# Patient Record
Sex: Male | Born: 1960 | Race: White | Hispanic: No | Marital: Married | Smoking: Current every day smoker
Health system: Southern US, Community
[De-identification: ages and names within clinical notes are randomized; demographics above are authoritative.]

## PROBLEM LIST (undated history)

## (undated) DIAGNOSIS — I10 Essential (primary) hypertension: Secondary | ICD-10-CM

## (undated) HISTORY — PX: APPENDECTOMY: SHX54

---

## 2011-09-12 ENCOUNTER — Encounter: Payer: Self-pay | Admitting: Emergency Medicine

## 2011-09-12 ENCOUNTER — Emergency Department
Admission: EM | Admit: 2011-09-12 | Discharge: 2011-09-12 | Disposition: A | Discharge status: Home / Self Care | Payer: BC Managed Care – PPO | Source: Home / Self Care | Attending: Emergency Medicine | Admitting: Emergency Medicine

## 2011-09-12 DIAGNOSIS — L723 Sebaceous cyst: Secondary | ICD-10-CM

## 2011-09-12 NOTE — ED Provider Notes (Addendum)
History     CSN: [id number]  Arrival date & time 09/12/11  1403   First MD Initiated Contact with Patient 09/12/11 1413      Chief Complaint  Patient presents with  . Cellulitis    (Consider location/radiation/quality/duration/timing/severity/associated sxs/prior treatment) HPI  History reviewed. No pertinent past medical history.  History reviewed. No pertinent past surgical history.  No family history on file.  History  Substance Use Topics  . Smoking status: Not on file  . Smokeless tobacco: Not on file  . Alcohol Use:       Review of Systems  Allergies  Review of patient's allergies indicates no known allergies.  Home Medications  No current outpatient prescriptions on file.  BP 134/83  Pulse 80  Temp(Src) 98.6 F (37 C) (Oral)  Resp 18  Ht 5\' 9"  (1.753 m)  Wt 194 lb (87.998 kg)  BMI 28.65 kg/m2  SpO2 99%  Physical Exam  ED Course  Procedures (including critical care time)  Labs Reviewed - No data to display No results found.   No diagnosis found.    MDM   This patient has a sebaceous cyst on his mid back and his left-sided space. I would be more comfortable with a dermatologist or plastic surgeon operating to remove the capsule and any other remnants that are necessary. Because of this, I will not bill him for today's visit and will instead sent directly to a dermatologist.  Lily Kocher, MD 09/12/11 1432  Lily Kocher, MD 09/12/11 [phone]

## 2011-09-12 NOTE — ED Notes (Signed)
Bump on left cheek and middle of back x 1 yr, Sebaceous cyst?

## 2012-07-19 ENCOUNTER — Emergency Department (INDEPENDENT_AMBULATORY_CARE_PROVIDER_SITE_OTHER)
Admission: EM | Admit: 2012-07-19 | Discharge: 2012-07-19 | Disposition: A | Discharge status: Home / Self Care | Payer: BC Managed Care – PPO | Source: Home / Self Care | Attending: Family Medicine | Admitting: Family Medicine

## 2012-07-19 ENCOUNTER — Emergency Department: Admission: EM | Admit: 2012-07-19 | Discharge: 2012-07-19 | Payer: Self-pay | Source: Home / Self Care

## 2012-07-19 ENCOUNTER — Encounter: Payer: Self-pay | Admitting: *Deleted

## 2012-07-19 DIAGNOSIS — R509 Fever, unspecified: Secondary | ICD-10-CM

## 2012-07-19 DIAGNOSIS — J111 Influenza due to unidentified influenza virus with other respiratory manifestations: Secondary | ICD-10-CM

## 2012-07-19 MED ORDER — OSELTAMIVIR PHOSPHATE 75 MG PO CAPS: 75.0000 mg | ORAL_CAPSULE | Freq: Two times a day (BID) | ORAL | Status: DC

## 2012-07-19 NOTE — ED Provider Notes (Signed)
History     CSN: [id number]  Arrival date & time 07/19/12  1220   First MD Initiated Contact with Patient 07/19/12 1246      Chief Complaint  Patient presents with  . Fever  . Chills  . Generalized Body Aches      HPI Comments: Pt complains of sudden onset fever, chills/sweats, headache, fatigue, and body aches for 2 days.  He has taken theraflu with no relief.   The history is provided by the patient.    History reviewed. No pertinent past medical history.  Past Surgical History  Procedure Date  . Appendectomy     History reviewed. No pertinent family history.  History  Substance Use Topics  . Smoking status: Current Every Day Smoker -- 1.0 packs/day for 35 years  . Smokeless tobacco: Not on file  . Alcohol Use: Yes      Review of Systems No sore throat No cough No pleuritic pain No wheezing No nasal congestion No post-nasal drainage No sinus pain/pressure No itchy/red eyes No earache No hemoptysis No SOB + fever, + chills No nausea No vomiting No abdominal pain No diarrhea No urinary symptoms No skin rashes + fatigue + myalgias + headache Used OTC meds without relief  Allergies  Review of patient's allergies indicates no known allergies.  Home Medications   Current Outpatient Rx  Name  Route  Sig  Dispense  Refill  . OSELTAMIVIR PHOSPHATE 75 MG PO CAPS   Oral   Take 1 capsule (75 mg total) by mouth every 12 (twelve) hours.   10 capsule   0     BP 112/82  Pulse 93  Temp 98.3 F (36.8 C) (Oral)  Resp 16  Ht 5\' 11"  (1.803 m)  Wt 195 lb (88.451 kg)  BMI 27.20 kg/m2  SpO2 98%  Physical Exam Nursing notes and Vital Signs reviewed. Appearance:  Patient appears healthy, stated age, and in no acute distress Eyes:  Pupils are equal, round, and reactive to light and accomodation.  Extraocular movement is intact.  Conjunctivae are not inflamed  Ears:  Canals normal.  Tympanic membranes normal.  Nose:  Mildly congested turbinates.  No  sinus tenderness.    Pharynx:  Normal Neck:  Supple.  Slightly tender shotty posterior nodes are palpated bilaterally  Lungs:  Clear to auscultation.  Breath sounds are equal.  Heart:  Regular rate and rhythm without murmurs, rubs, or gallops.  Abdomen:  Nontender without masses or hepatosplenomegaly.  Bowel sounds are present.  No CVA or flank tenderness.  Extremities:  No edema.  No calf tenderness Skin:  No rash present.   ED Course  Procedures  none   Labs Reviewed  POCT INFLUENZA A/B negative      1. Influenza-like illness       MDM  Empirically begin Tamiflu. Take Mucinex D (guaifenesin with decongestant) twice daily for congestion.  Increase fluid intake, rest. May use Afrin nasal spray (or generic oxymetazoline) twice daily for about 5 days.  Also recommend using saline nasal spray several times daily and saline nasal irrigation (AYR is a common brand) Stop all antihistamines for now, and other non-prescription cough/cold preparations. May take Ibuprofen 200mg , 4 tabs every 8 hours with food for headache, fever, etc. May take Delsym Cough Suppressant at bedtime for nighttime cough.  Recommend flu shot when well. Follow-up with family doctor if not improving 7 to 10 days.         Lattie Haw, MD 07/26/12 1025

## 2012-07-19 NOTE — ED Notes (Signed)
Pt c/o fever, chills and body aches x 2 days.  He has taken theraflu with no relief.

## 2012-07-19 NOTE — ED Notes (Signed)
Pt c/o fever, chills and body aches x 2 days. He has taken Theraflu with no relief.

## 2012-07-30 LAB — POCT INFLUENZA A/B
Influenza A, POC: NEGATIVE
Influenza B, POC: NEGATIVE

## 2012-08-03 ENCOUNTER — Encounter: Payer: Self-pay | Admitting: *Deleted

## 2012-08-03 ENCOUNTER — Emergency Department (INDEPENDENT_AMBULATORY_CARE_PROVIDER_SITE_OTHER): Payer: BC Managed Care – PPO

## 2012-08-03 ENCOUNTER — Emergency Department (INDEPENDENT_AMBULATORY_CARE_PROVIDER_SITE_OTHER)
Admission: EM | Admit: 2012-08-03 | Discharge: 2012-08-03 | Disposition: A | Discharge status: Home / Self Care | Payer: BC Managed Care – PPO | Source: Home / Self Care | Attending: Family Medicine | Admitting: Family Medicine

## 2012-08-03 DIAGNOSIS — R6883 Chills (without fever): Secondary | ICD-10-CM

## 2012-08-03 DIAGNOSIS — R05 Cough: Secondary | ICD-10-CM

## 2012-08-03 DIAGNOSIS — J209 Acute bronchitis, unspecified: Secondary | ICD-10-CM

## 2012-08-03 DIAGNOSIS — R918 Other nonspecific abnormal finding of lung field: Secondary | ICD-10-CM

## 2012-08-03 MED ORDER — LEVOFLOXACIN 500 MG PO TABS: 500.0000 mg | ORAL_TABLET | Freq: Every day | ORAL | Status: DC

## 2012-08-03 MED ORDER — CEFTRIAXONE SODIUM 1 G IJ SOLR
1.0000 g | Freq: Once | INTRAMUSCULAR | Status: AC
  Administered 2012-08-03: 1 g via INTRAMUSCULAR

## 2012-08-03 MED ORDER — BENZONATATE 200 MG PO CAPS: 200.0000 mg | ORAL_CAPSULE | Freq: Every day | ORAL | Status: DC

## 2012-08-03 NOTE — ED Notes (Signed)
Patient was seen 2 weeks ago for fever, chills, sweats, checked for flu which was negative. He finished Tamiflu Rx and improved but symptoms returned along with congestion 5 days ago. His wife came down with similar symptoms after his first visit. He has taken Dayquil and advil

## 2012-08-03 NOTE — ED Provider Notes (Signed)
History     CSN: [id number]  Arrival date & time 08/03/12  1224   First MD Initiated Contact with Patient 08/03/12 1318      Chief Complaint  Patient presents with  . Nasal Congestion  . Chills     HPI Comments: Patient reports that his flu symptoms essentially resolved several days after starting Tamiflu.  However, 6 days ago he again developed flu like symptoms with fever/chills, sinus congestion, mild sore throat, headache, and partly productive cough.  He has developed a mild pleuritic pain in his right anterior chest.  The history is provided by the patient.    History reviewed. No pertinent past medical history.  Past Surgical History  Procedure Date  . Appendectomy     History reviewed. No pertinent family history.  History  Substance Use Topics  . Smoking status: Current Every Day Smoker -- 1.0 packs/day for 35 years  . Smokeless tobacco: Not on file  . Alcohol Use: Yes      Review of Systems + sore throat + cough ? pleuritic pain right anterior chest + wheezing + nasal congestion + post-nasal drainage No sinus pain/pressure No itchy/red eyes No earache No hemoptysis No SOB + fever, + chills No nausea No vomiting No abdominal pain No diarrhea No urinary symptoms No skin rashes + fatigue + myalgias + headache Used OTC meds without relief  Allergies  Review of patient's allergies indicates no known allergies.  Home Medications   Current Outpatient Rx  Name  Route  Sig  Dispense  Refill  . BENZONATATE 200 MG PO CAPS   Oral   Take 1 capsule (200 mg total) by mouth at bedtime. Take as needed for cough   12 capsule   0   . LEVOFLOXACIN 500 MG PO TABS   Oral   Take 1 tablet (500 mg total) by mouth daily.   7 tablet   0   . OSELTAMIVIR PHOSPHATE 75 MG PO CAPS   Oral   Take 1 capsule (75 mg total) by mouth every 12 (twelve) hours.   10 capsule   0     BP 117/78  Pulse 95  Temp 98.5 F (36.9 C) (Oral)  Resp 18  Ht 5\' 10"   (1.778 m)  Wt 189 lb (85.73 kg)  BMI 27.12 kg/m2  SpO2 98%  Physical Exam Nursing notes and Vital Signs reviewed. Appearance:  Patient appears healthy, stated age, and in no acute distress Eyes:  Pupils are equal, round, and reactive to light and accomodation.  Extraocular movement is intact.  Conjunctivae are not inflamed  Ears:  Canals normal.  Tympanic membranes normal.  Nose:  Mildly congested turbinates.  No sinus tenderness.  Pharynx:  Normal Neck:  Supple.   No adenopathy Lungs:  Clear to auscultation.  Breath sounds are equal.  Heart:  Regular rate and rhythm without murmurs, rubs, or gallops.  Abdomen:  Nontender without masses or hepatosplenomegaly.  Bowel sounds are present.  No CVA or flank tenderness.  Extremities:  No edema.  No calf tenderness Skin:  No rash present.   ED Course  Procedures (including critical care time)  Labs Reviewed - No data to display Dg Chest 2 View  08/03/2012  *RADIOLOGY REPORT*  Clinical Data: Chills and cough.  CHEST - 2 VIEW  Comparison: None  Findings: The cardiac silhouette, mediastinal and hilar contours are normal.  Ill-defined left lower lobe airspace opacity could reflect a developing infiltrate.  Mild peribronchial thickening is noted.  No pleural effusion or pneumothorax.  The bony thorax is intact.  IMPRESSION: Probable mild bronchitis with developing left lower lobe infiltrate.   Original Report Authenticated By: Rudie Meyer, M.D.      1. Acute bronchitis; possible early left lower lobe pneumonia       MDM  Rocephin 1 gm IM.  Begin Levaquin 500mg  once daily. Prescription written for Benzonatate [facility]) to take at bedtime for night-time cough.  Take plain Mucinex (guaifenesin) twice daily for cough and congestion.  May add Sudafed for sinus congestion.  Increase fluid intake, rest. Stop all antihistamines for now, and other non-prescription cough/cold preparations. May take Tylenol for pain, fever, etc. Follow-up with  family doctor in about 10 days for repeat chest X-ray Return for worsening symptoms:  shortness of breath, increasing chest pain, etc.         Lattie Haw, MD 08/03/12 [phone]

## 2013-12-14 ENCOUNTER — Emergency Department (INDEPENDENT_AMBULATORY_CARE_PROVIDER_SITE_OTHER): Payer: 59

## 2013-12-14 ENCOUNTER — Emergency Department
Admission: EM | Admit: 2013-12-14 | Discharge: 2013-12-14 | Disposition: A | Discharge status: Home / Self Care | Payer: 59 | Source: Home / Self Care

## 2013-12-14 ENCOUNTER — Encounter: Payer: Self-pay | Admitting: Emergency Medicine

## 2013-12-14 DIAGNOSIS — R079 Chest pain, unspecified: Secondary | ICD-10-CM

## 2013-12-14 DIAGNOSIS — IMO0002 Reserved for concepts with insufficient information to code with codable children: Secondary | ICD-10-CM

## 2013-12-14 DIAGNOSIS — F17200 Nicotine dependence, unspecified, uncomplicated: Secondary | ICD-10-CM

## 2013-12-14 DIAGNOSIS — S29011A Strain of muscle and tendon of front wall of thorax, initial encounter: Secondary | ICD-10-CM

## 2013-12-14 NOTE — ED Notes (Signed)
Wesley Oneill c/o left sided CP x 2-3 days. Denies SOB, numbness, nausea, etc. C/o increase in stress. No recent activity.

## 2013-12-14 NOTE — Discharge Instructions (Signed)
Apply ice pack for 30 to 45 minutes 3 to 4 times daily for 2 to 3 days.  May take Tylenol as needed for pain.    Muscle Strain A muscle strain is an injury that occurs when a muscle is stretched beyond its normal length. Usually a small number of muscle fibers are torn when this happens. Muscle strain is rated in degrees. First-degree strains have the least amount of muscle fiber tearing and pain. Second-degree and third-degree strains have increasingly more tearing and pain.  Usually, recovery from muscle strain takes 1 2 weeks. Complete healing takes 5 6 weeks.  CAUSES  Muscle strain happens when a sudden, violent force placed on a muscle stretches it too far. This may occur with lifting, sports, or a fall.  RISK FACTORS Muscle strain is especially common in athletes.  SIGNS AND SYMPTOMS At the site of the muscle strain, there may be:  Pain.  Bruising.  Swelling.  Difficulty using the muscle due to pain or lack of normal function. DIAGNOSIS  Your health care provider will perform a physical exam and ask about your medical history. TREATMENT  Often, the best treatment for a muscle strain is resting, icing, and applying cold compresses to the injured area.  HOME CARE INSTRUCTIONS   Use the PRICE method of treatment to promote muscle healing during the first 2 3 days after your injury. The PRICE method involves:  Protecting the muscle from being injured again.  Restricting your activity and resting the injured body part.  Icing your injury. To do this, put ice in a plastic bag. Place a towel between your skin and the bag. Then, apply the ice and leave it on from 15 20 minutes each hour. After the third day, switch to moist heat packs.  Apply compression to the injured area with a splint or elastic bandage. Be careful not to wrap it too tightly. This may interfere with blood circulation or increase swelling.  Elevate the injured body part above the level of your heart as often as  you can.  Only take over-the-counter or prescription medicines for pain, discomfort, or fever as directed by your health care provider.  Warming up prior to exercise helps to prevent future muscle strains. SEEK MEDICAL CARE IF:   You have increasing pain or swelling in the injured area.  You have numbness, tingling, or a significant loss of strength in the injured area. MAKE SURE YOU:   Understand these instructions.  Will watch your condition.  Will get help right away if you are not doing well or get worse. Document Released: 07/21/2005 Document Revised: 05/11/2013 Document Reviewed: 02/17/2013 [facility] Patient Information 2014 [place], [place].

## 2013-12-14 NOTE — ED Provider Notes (Signed)
CSN: [id number]     Arrival date & time 12/14/13  1338 History   None    Chief Complaint  Patient presents with  . Chest Pain     HPI Comments: Patient noticed dull left upper chest pain two days ago that has persisted.  The pain does not radiate.  He is a smoker and states that his chronic smoker's cough has increased somewhat over the past two days, although his pain is not pleuritic.  No shortness of breath or wheezing.  No fevers, chills, and sweats.  No nausea/vomiting.  The pain is not related to eating. Past history negative Family history of ASCVD in father who had stent place in his 770's  Patient is a 53 y.o. male presenting with chest pain. The history is provided by the patient.  Chest Pain Pain location:  L chest Pain quality: dull   Pain quality: not radiating and not stabbing   Pain radiates to:  Does not radiate Pain radiates to the back: no   Pain severity:  Mild Onset quality:  Gradual Duration:  2 days Timing:  Constant Progression:  Unchanged Chronicity:  New Context: at rest   Relieved by:  Nothing Worsened by:  Movement Ineffective treatments: Ibuprofen. Associated symptoms: cough   Associated symptoms: no abdominal pain, no back pain, no diaphoresis, no dysphagia, no fatigue, no fever, no heartburn, no lower extremity edema, no nausea, no numbness, no palpitations, no PND, no shortness of breath, not vomiting and no weakness   Risk factors: male sex and smoking     History reviewed. No pertinent past medical history. Past Surgical History  Procedure Laterality Date  . Appendectomy     Family History  Problem Relation Age of Onset  . Heart disease Father    History  Substance Use Topics  . Smoking status: Current Every Day Smoker -- 1.00 packs/day for 35 years  . Smokeless tobacco: Never Used  . Alcohol Use: Yes    Review of Systems  Constitutional: Negative for fever, diaphoresis and fatigue.  HENT: Negative for trouble swallowing.     Respiratory: Positive for cough. Negative for shortness of breath.   Cardiovascular: Positive for chest pain. Negative for palpitations and PND.  Gastrointestinal: Negative for heartburn, nausea, vomiting and abdominal pain.  Musculoskeletal: Negative for back pain.  Neurological: Negative for weakness and numbness.  All other systems reviewed and are negative.   Allergies  Review of patient's allergies indicates no known allergies.  Home Medications   Prior to Admission medications   Medication Sig Start Date End Date Taking? Authorizing Provider  benzonatate (TESSALON) 200 MG capsule Take 1 capsule (200 mg total) by mouth at bedtime. Take as needed for cough 08/03/12   Lattie HawStephen A Beese, MD  levofloxacin (LEVAQUIN) 500 MG tablet Take 1 tablet (500 mg total) by mouth daily. 08/03/12   Lattie HawStephen A Beese, MD  oseltamivir (TAMIFLU) 75 MG capsule Take 1 capsule (75 mg total) by mouth every 12 (twelve) hours. 07/19/12   Lattie HawStephen A Beese, MD   BP 169/97  Pulse 79  Resp 14  SpO2 98% Physical Exam  Nursing note and vitals reviewed. Constitutional: He is oriented to person, place, and time. He appears well-developed and well-nourished. No distress.  HENT:  Head: Normocephalic.  Nose: Nose normal.  Mouth/Throat: Oropharynx is clear and moist.  Eyes: Conjunctivae and EOM are normal. Pupils are equal, round, and reactive to light.  Neck: Neck supple.  Cardiovascular: Normal rate, regular rhythm, normal heart sounds  and intact distal pulses.  Exam reveals no gallop and no friction rub.   No murmur heard. Pulmonary/Chest: Effort normal and breath sounds normal. He has no wheezes. He has no rales. He exhibits tenderness.    There is distinct tenderness over the left pectoralis muscle, worse with resisted flexion of the left pectoralis muscle.  Abdominal: Soft. Bowel sounds are normal. There is no tenderness.  Lymphadenopathy:    He has no cervical adenopathy.  Neurological: He is alert and  oriented to person, place, and time.  Skin: Skin is warm and dry. No rash noted. No erythema.    ED Course  Procedures  none    Labs Reviewed -  EKG: Rate 84 PR:  0.140 QT:  0.370 QRS:  0.92 Axis:  24 degrees Interpretation:  within normal limits   Imaging Review Dg Chest 2 View  12/14/2013   CLINICAL DATA:  Chest pain, smoker, tender to palpation over pectoralis muscle  EXAM: CHEST  2 VIEW  COMPARISON:  08/03/2012  FINDINGS: Cardiomediastinal silhouette is stable. No acute infiltrate or pleural effusion. No pulmonary edema. Mild hyperinflation. Mild degenerative changes thoracic spine.  IMPRESSION: No active cardiopulmonary disease.  Mild hyperinflation.   Electronically Signed   By: Natasha MeadLiviu  Pop M.D.   On: 12/14/2013 14:23     MDM   1. Pectoralis muscle strain     Apply ice pack for 30 to 45 minutes 3 to 4 times daily for 2 to 3 days.  May take Tylenol as needed for pain.  Followup with Dr. Rodney Langtonhomas Thekkekand[facility] ([facility]) if not improving about two weeks.     Lattie HawStephen A Beese, MD 12/14/13 [phone]

## 2013-12-27 ENCOUNTER — Encounter: Payer: Self-pay | Admitting: Emergency Medicine

## 2014-10-21 IMAGING — CR DG CHEST 2V
2 series · 2 of 2 positions shown · non-contrast
Comparison: 08/03/2012

CLINICAL DATA: Chest pain, smoker, tender to palpation over
pectoralis muscle

EXAM:
CHEST  2 VIEW

[view not recorded (1 of 2)]
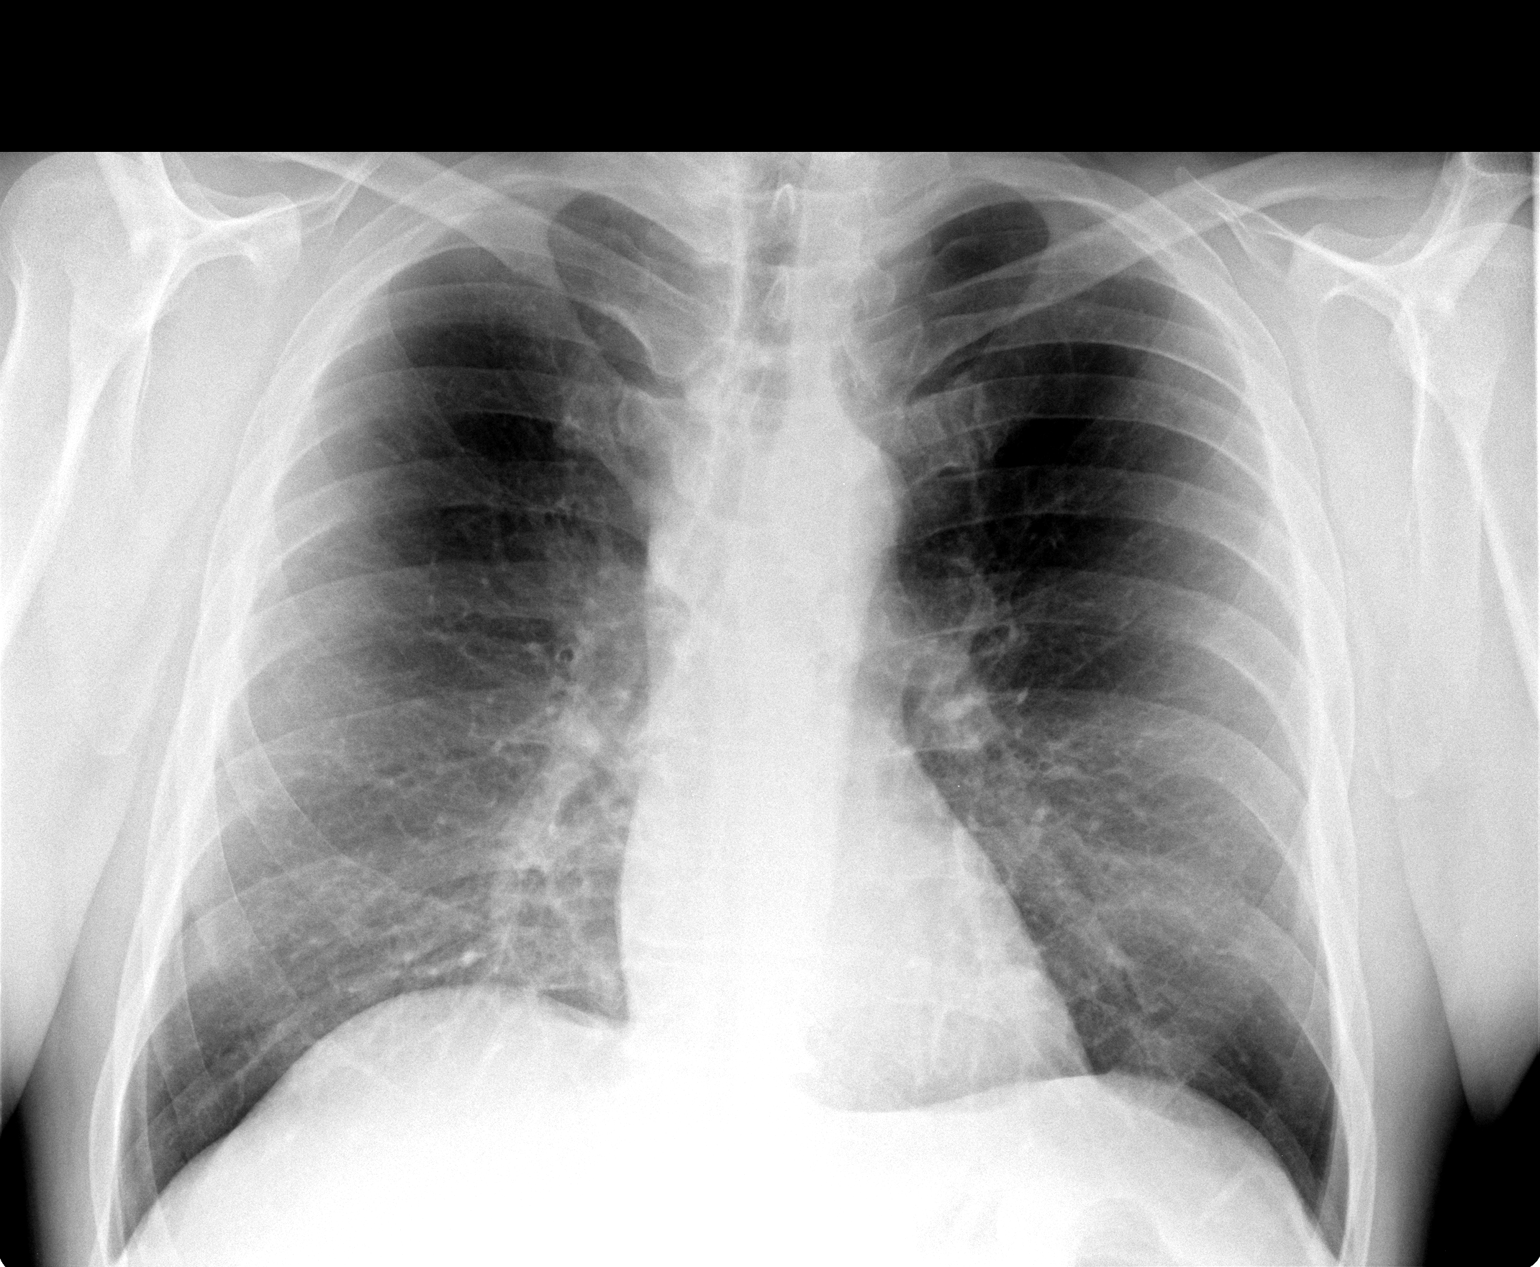

[view not recorded (2 of 2)]
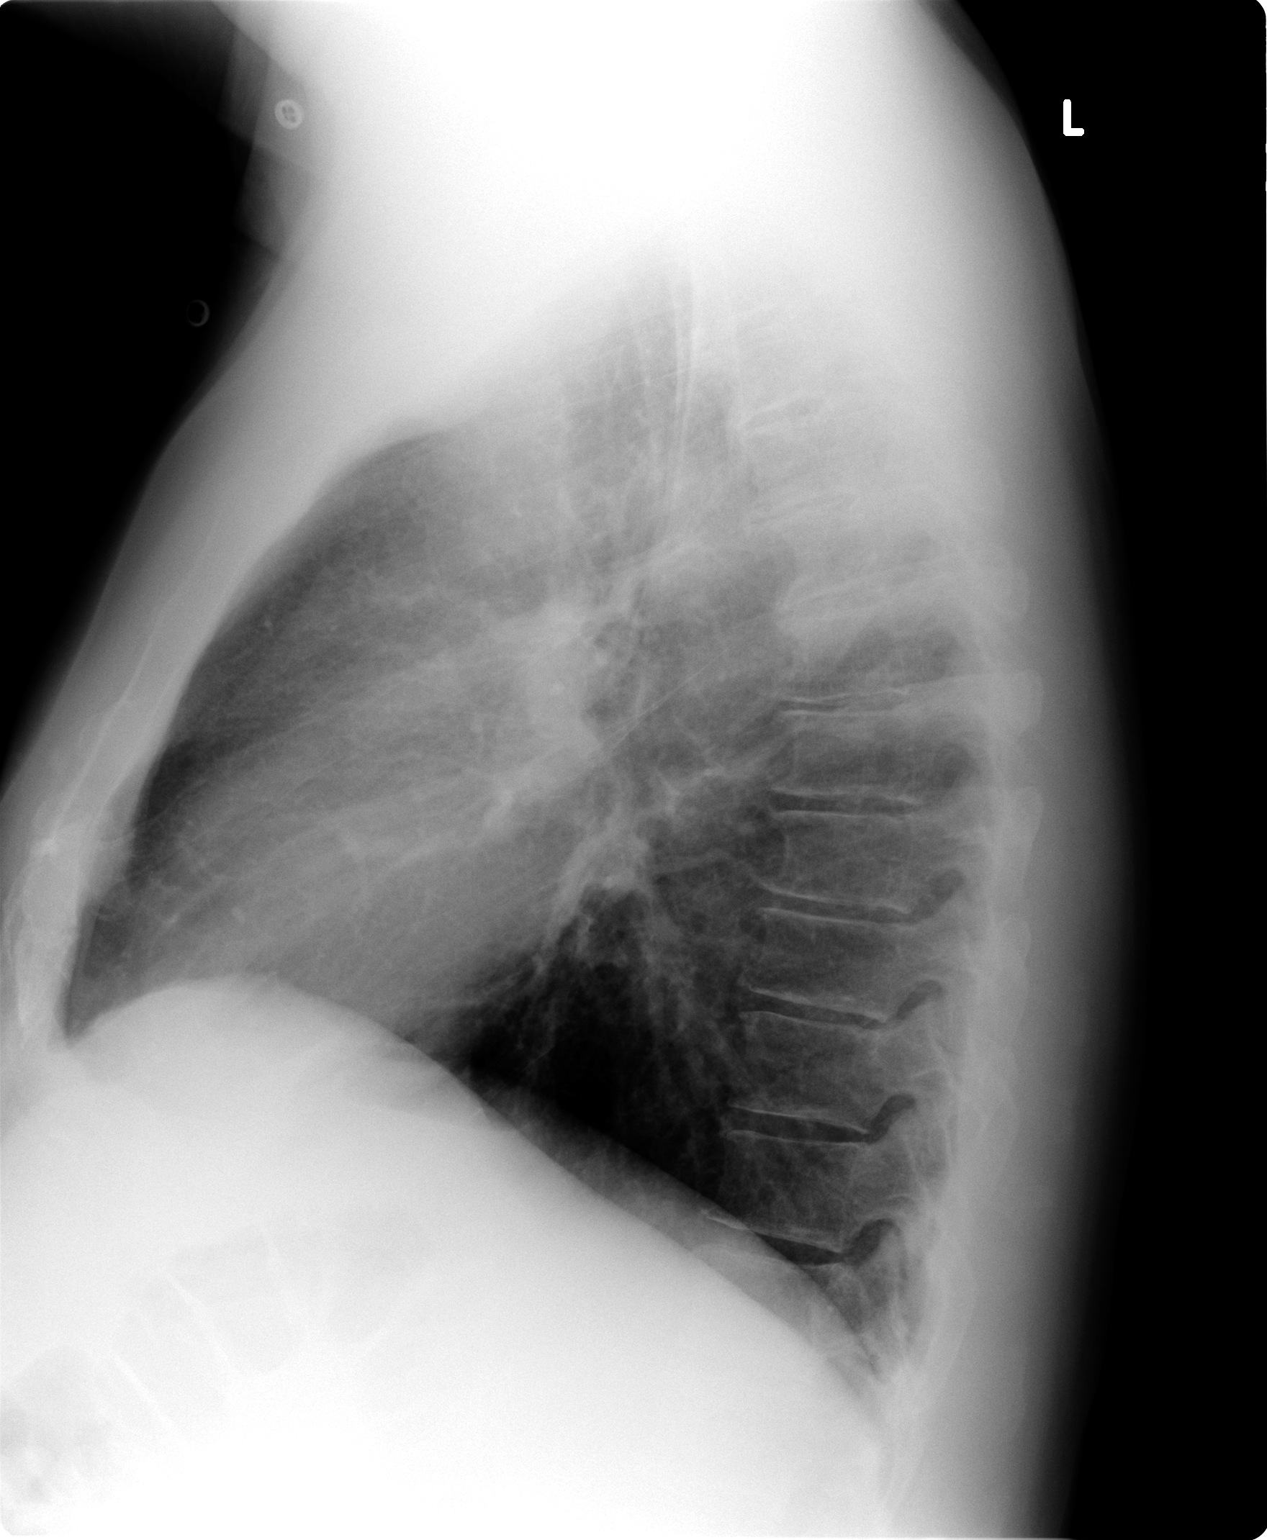

[2 of 2 positions shown; findings below may reference images not displayed]

FINDINGS: Cardiomediastinal silhouette is stable. No acute infiltrate or
pleural effusion. No pulmonary edema. Mild hyperinflation. Mild
degenerative changes thoracic spine.
IMPRESSION: No active cardiopulmonary disease.  Mild hyperinflation.

## 2017-06-06 ENCOUNTER — Emergency Department
Admission: EM | Admit: 2017-06-06 | Discharge: 2017-06-06 | Disposition: A | Discharge status: Home / Self Care | Payer: 59 | Source: Home / Self Care | Attending: Family Medicine | Admitting: Family Medicine

## 2017-06-06 ENCOUNTER — Encounter: Payer: Self-pay | Admitting: Emergency Medicine

## 2017-06-06 DIAGNOSIS — L03011 Cellulitis of right finger: Secondary | ICD-10-CM | POA: Diagnosis not present

## 2017-06-06 HISTORY — DX: Essential (primary) hypertension: I10

## 2017-06-06 MED ORDER — MUPIROCIN 2 % EX OINT: TOPICAL_OINTMENT | CUTANEOUS | 0 refills | Status: DC

## 2017-06-06 MED ORDER — DOXYCYCLINE HYCLATE 100 MG PO CAPS: 100.0000 mg | ORAL_CAPSULE | Freq: Two times a day (BID) | ORAL | 0 refills | Status: DC

## 2017-06-06 NOTE — ED Provider Notes (Signed)
[facility]    CSN: [id number] Arrival date & time: 06/06/17  1258     History   Chief Complaint Chief Complaint  Patient presents with  . Hand Pain    HPI Wesley Oneill is a 56 y.o. male.   HPI Wesley Oneill is a 56 y.o. male presenting to UC with c/o gradually worsening Right little finger pain, swelling and redness that started a few weeks ago but worsened over the last 2-3 days.  He works with his hands a lot and initially thought he hit it on something and symptoms would go away but he has been in and out of the hospital visiting a family member and has not had time to get his finger evaluated. His brother convinced him to be evaluated today due to fears pt may have MRSA infection.  Denies fever, chills, n/v/d. He has never been dx with MRSA in the past.   Past Medical History:  Diagnosis Date  . Hypertension     There are no active problems to display for this patient.   Past Surgical History:  Procedure Laterality Date  . APPENDECTOMY         Home Medications    Prior to Admission medications   Medication Sig Start Date End Date Taking? Authorizing Provider  doxycycline (VIBRAMYCIN) 100 MG capsule Take 1 capsule (100 mg total) by mouth 2 (two) times daily. One po bid x 10 days 06/06/17   Lurene Shadow, PA-C  mupirocin ointment Idelle Jo) 2 % Apply to finger 3 times daily for 7 days 06/06/17   Lurene Shadow, PA-C    Family History Family History  Problem Relation Age of Onset  . Heart disease Father     Social History Social History  Substance Use Topics  . Smoking status: Current Every Day Smoker    Packs/day: 1.00    Years: 35.00  . Smokeless tobacco: Never Used  . Alcohol use Yes     Allergies   Patient has no known allergies.   Review of Systems Review of Systems  Constitutional: Negative for chills and fever.  Musculoskeletal: Positive for arthralgias, joint swelling and myalgias.  Skin: Positive for color change.  Negative for wound.  Neurological: Negative for weakness and numbness.     Physical Exam Triage Vital Signs ED Triage Vitals  Enc Vitals Group     BP 06/06/17 1318 (!) 160/92     Pulse Rate 06/06/17 1318 84     Resp 06/06/17 1318 16     Temp 06/06/17 1318 98.7 F (37.1 C)     Temp Source 06/06/17 1318 Oral     SpO2 06/06/17 1318 98 %     Weight 06/06/17 1319 170 lb (77.1 kg)     Height 06/06/17 1319 5\' 10"  (1.778 m)     Head Circumference --      Peak Flow --      Pain Score --      Pain Loc --      Pain Edu? --      Excl. in GC? --    No data found.   Updated Vital Signs BP (!) 160/92 (BP Location: Left Arm)   Pulse 84   Temp 98.7 F (37.1 C) (Oral)   Resp 16   Ht 5\' 10"  (1.778 m)   Wt 170 lb (77.1 kg)   SpO2 98%   BMI 24.39 kg/m   Visual Acuity Right Eye Distance:   Left Eye Distance:  Bilateral Distance:    Right Eye Near:   Left Eye Near:    Bilateral Near:     Physical Exam  Constitutional: He is oriented to person, place, and time. He appears well-developed and well-nourished. No distress.  HENT:  Head: Normocephalic and atraumatic.  Eyes: EOM are normal.  Neck: Normal range of motion.  Cardiovascular: Normal rate.   Pulmonary/Chest: Effort normal.  Musculoskeletal: Normal range of motion. He exhibits edema and tenderness.  Right little finger: mild to moderate edema to distal aspect. Full ROM. Mild tenderness to distal aspect of finger.   Neurological: He is alert and oriented to person, place, and time.  Skin: Skin is warm and dry. He is not diaphoretic. There is erythema.  Right little finger: erythema and edema to distal phalanx, tender. No fluctuance. No bleeding or drainage.   Psychiatric: He has a normal mood and affect. His behavior is normal.  Nursing note and vitals reviewed.    UC Treatments / Results  Labs (all labs ordered are listed, but only abnormal results are displayed) Labs Reviewed - No data to display  EKG  EKG  Interpretation None       Radiology No results found.  Procedures Procedures (including critical care time)  Medications Ordered in UC Medications - No data to display   Initial Impression / Assessment and Plan / UC Course  I have reviewed the triage vital signs and the nursing notes.  Pertinent labs & imaging results that were available during my care of the patient were reviewed by me and considered in my medical decision making (see chart for details).     Hx and exam c/w cellulitis Right little finger. No areas of fluctuance on exam. Will not I&D at this time. Encouraged soaking in warm saltwater 2-3 times daily Start taking Doxycycline and using mupirocin ointment. F/u in 3-4 days if not improving, sooner if worsening.   Final Clinical Impressions(s) / UC Diagnoses   Final diagnoses:  Cellulitis of right little finger    New Prescriptions Discharge Medication List as of 06/06/2017  1:24 PM    START taking these medications   Details  doxycycline (VIBRAMYCIN) 100 MG capsule Take 1 capsule (100 mg total) by mouth 2 (two) times daily. One po bid x 10 days, Starting Sat 06/06/2017, Normal    mupirocin ointment (BACTROBAN) 2 % Apply to finger 3 times daily for 7 days, Normal         Controlled Substance Prescriptions [place] Controlled Substance Registry consulted? Not Applicable   Rolla Platehelps, [place] Morren O, PA-C 06/06/17 1551

## 2017-06-06 NOTE — Discharge Instructions (Signed)
°  You should try soaking your finger in warm saltwater 2-3 times daily for 10-15 minutes at a time to help soften the skin. Gently pat dry your finger, then apply the antibiotic ointment- mupirocin and a bandage.   Please take antibiotics as prescribed and be sure to complete entire course even if you start to feel better to ensure infection does not come back.  You may take 500mg  acetaminophen every 4-6 hours or in combination with ibuprofen 400-600mg  every 6-8 hours as needed for pain and inflammation.

## 2017-06-06 NOTE — ED Triage Notes (Signed)
[facility] in the right little finger, finger is swollen times two weeks, patient denies injury, and states he is unsure the cause.

## 2017-07-02 ENCOUNTER — Emergency Department
Admission: EM | Admit: 2017-07-02 | Discharge: 2017-07-02 | Disposition: A | Discharge status: Home / Self Care | Payer: 59 | Source: Home / Self Care | Attending: Family Medicine | Admitting: Family Medicine

## 2017-07-02 ENCOUNTER — Encounter: Payer: Self-pay | Admitting: Emergency Medicine

## 2017-07-02 ENCOUNTER — Other Ambulatory Visit: Payer: Self-pay

## 2017-07-02 ENCOUNTER — Emergency Department (INDEPENDENT_AMBULATORY_CARE_PROVIDER_SITE_OTHER): Payer: 59

## 2017-07-02 DIAGNOSIS — R683 Clubbing of fingers: Secondary | ICD-10-CM

## 2017-07-02 DIAGNOSIS — M7989 Other specified soft tissue disorders: Secondary | ICD-10-CM

## 2017-07-02 DIAGNOSIS — M79644 Pain in right finger(s): Secondary | ICD-10-CM

## 2017-07-02 MED ORDER — CLINDAMYCIN HCL 300 MG PO CAPS: ORAL_CAPSULE | ORAL | 0 refills | Status: DC

## 2017-07-02 NOTE — ED Triage Notes (Signed)
Patient here for follow up from 06/06/17; has had infection around nail of right little finger for about 6 weeks now and despite antibiotics it is still sore and edematous.

## 2017-07-02 NOTE — Discharge Instructions (Signed)
May take Tylenol for pain as needed. °

## 2017-07-02 NOTE — ED Provider Notes (Signed)
[facility]    CSN: [id number] Arrival date & time: 07/02/17  1541     History   Chief Complaint Chief Complaint  Patient presents with  . Hand Pain    right little finger infection nail     HPI Wesley Oneill is a 56 y.o. male.   Patient recently treated for cellulitis of his right fifth finger.  He had partial improvement after taking doxycycline and applying mupirocin ointment, but complains of persistent soreness in his fingertip.  He is assymptomatic otherwise.  He recalls accidentally hitting his fingertip about 6 weeks ago with a 5 pound hammer.   The history is provided by the patient.  Hand Pain  This is a recurrent problem. Episode onset: 6 weeks ago. The problem occurs constantly. The problem has been gradually improving. Pertinent negatives include no chest pain, no abdominal pain, no headaches and no shortness of breath. Exacerbated by: touching finger. Nothing relieves the symptoms. Treatments tried: antibiotic. The treatment provided mild relief.    Past Medical History:  Diagnosis Date  . Hypertension     There are no active problems to display for this patient.   Past Surgical History:  Procedure Laterality Date  . APPENDECTOMY         Home Medications    Prior to Admission medications   Medication Sig Start Date End Date Taking? Authorizing Provider  clindamycin (CLEOCIN) 300 MG capsule Take one cap by mouth every 8 hours 07/02/17   Lattie HawBeese, Stephen A, MD    Family History Family History  Problem Relation Age of Onset  . Heart disease Father     Social History Social History   Tobacco Use  . Smoking status: Current Every Day Smoker    Packs/day: 1.00    Years: 35.00    Pack years: 35.00  . Smokeless tobacco: Never Used  Substance Use Topics  . Alcohol use: Yes  . Drug use: No     Allergies   Patient has no known allergies.   Review of Systems Review of Systems  Constitutional: Negative for activity change,  appetite change, chills, diaphoresis, fatigue and fever.  HENT: Negative.   Eyes: Negative.   Respiratory: Negative for chest tightness and shortness of breath.   Cardiovascular: Negative for chest pain and palpitations.  Gastrointestinal: Negative for abdominal pain.  Genitourinary: Negative.   Neurological: Negative for headaches.     Physical Exam Triage Vital Signs ED Triage Vitals  Enc Vitals Group     BP 07/02/17 1606 136/79     Pulse Rate 07/02/17 1606 82     Resp 07/02/17 1606 16     Temp 07/02/17 1606 98.8 F (37.1 C)     Temp Source 07/02/17 1606 Oral     SpO2 07/02/17 1606 100 %     Weight 07/02/17 1607 175 lb (79.4 kg)     Height 07/02/17 1607 5\' 9"  (1.753 m)     Head Circumference --      Peak Flow --      Pain Score 07/02/17 1608 3     Pain Loc --      Pain Edu? --      Excl. in GC? --    No data found.  Updated Vital Signs BP 136/79 (BP Location: Left Arm)   Pulse 82   Temp 98.8 F (37.1 C) (Oral)   Resp 16   Ht 5\' 9"  (1.753 m)   Wt 175 lb (79.4 kg)   SpO2 100%  BMI 25.84 kg/m   Visual Acuity Right Eye Distance:   Left Eye Distance:   Bilateral Distance:    Right Eye Near:   Left Eye Near:    Bilateral Near:     Physical Exam  Constitutional: He appears well-developed and well-nourished. No distress.  HENT:  Head: Normocephalic.  Right Ear: External ear normal.  Left Ear: External ear normal.  Nose: Nose normal.  Mouth/Throat: Oropharynx is clear and moist.  Eyes: Conjunctivae are normal. Pupils are equal, round, and reactive to light.  Neck: Neck supple.  Cardiovascular: Normal heart sounds.  Pulmonary/Chest: Breath sounds normal.  Musculoskeletal: He exhibits no edema.       Hands: Right 5th fingertip has distinct clubbing of nail.  There is diffuse mild tenderness of distal phalanx and DIP joint.  Sensation intact.  Lymphadenopathy:    He has no cervical adenopathy.  Neurological: He is alert.  Skin: Skin is dry.  Nursing  note and vitals reviewed.    UC Treatments / Results  Labs (all labs ordered are listed, but only abnormal results are displayed) Labs Reviewed - No data to display  EKG  EKG Interpretation None       Radiology Dg Finger Little Right  Result Date: 07/02/2017 CLINICAL DATA:  Right little finger pain. Redness and swelling for 1 month. EXAM: RIGHT LITTLE FINGER 2+V COMPARISON:  No prior. FINDINGS: Soft tissue swelling noted of the distal aspect of the right fifth digit. Destructive changes are noted of the distal phalanx. An expansile lesion may be present. An active bony tumor including metastatic lesion could present this fashion. Infection could present this fashion. Orthopedic evaluation suggested. Diffuse degenerative change. No radiopaque foreign body. IMPRESSION: Destructive changes noted of the distal phalanx of the right fifth digit as described above. Associated soft tissue swelling. An active bony tumor including metastatic lesion could present this fashion. Infection could present this fashion. Orthopedic evaluation suggested. These results will be called to the ordering clinician or representative by the Radiologist Assistant, and communication documented in the PACS or zVision Dashboard. Electronically Signed   By: Maisie Fushomas  Register   On: 07/02/2017 16:45    Procedures Procedures (including critical care time)  Medications Ordered in UC Medications - No data to display   Initial Impression / Assessment and Plan / UC Course  I have reviewed the triage vital signs and the nursing notes.  Pertinent labs & imaging results that were available during my care of the patient were reviewed by me and considered in my medical decision making (see chart for details).    Destructive changes of right 5th finger distal phalanx, presumably initiated by trauma. Will need biopsy of lesion of distal phalanx (refer to orthopedist) Begin empiric clindamycin 300mg  TID. May take Tylenol for  pain as needed.      Final Clinical Impressions(s) / UC Diagnoses   Final diagnoses:  Finger clubbing  Pain of finger of right hand    ED Discharge Orders        Ordered    clindamycin (CLEOCIN) 300 MG capsule     07/02/17 1718           Lattie HawBeese, Stephen A, MD 07/02/17 2030

## 2017-07-03 ENCOUNTER — Telehealth: Payer: Self-pay | Admitting: Emergency Medicine

## 2017-08-07 ENCOUNTER — Other Ambulatory Visit: Payer: Self-pay

## 2018-05-09 IMAGING — DX DG FINGER LITTLE 2+V*R*
3 series · 3 of 3 positions shown · non-contrast
Comparison: No prior.

CLINICAL DATA: Right little finger pain. Redness and swelling for 1
month.

EXAM:
RIGHT LITTLE FINGER 2+V

[finger ap]
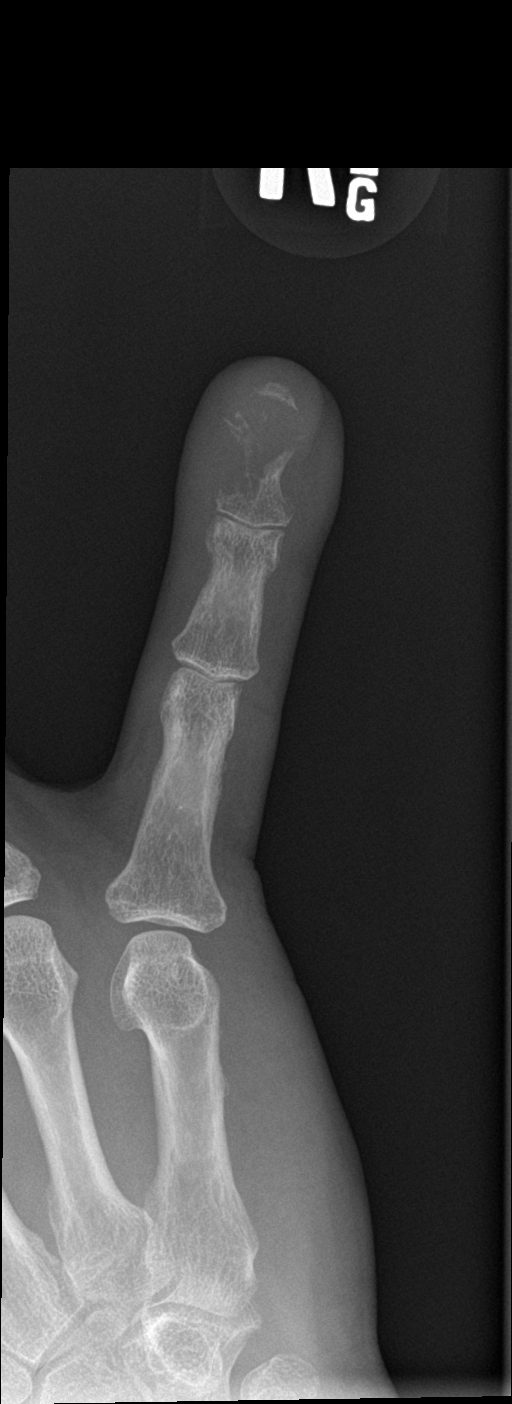

[finger obl]
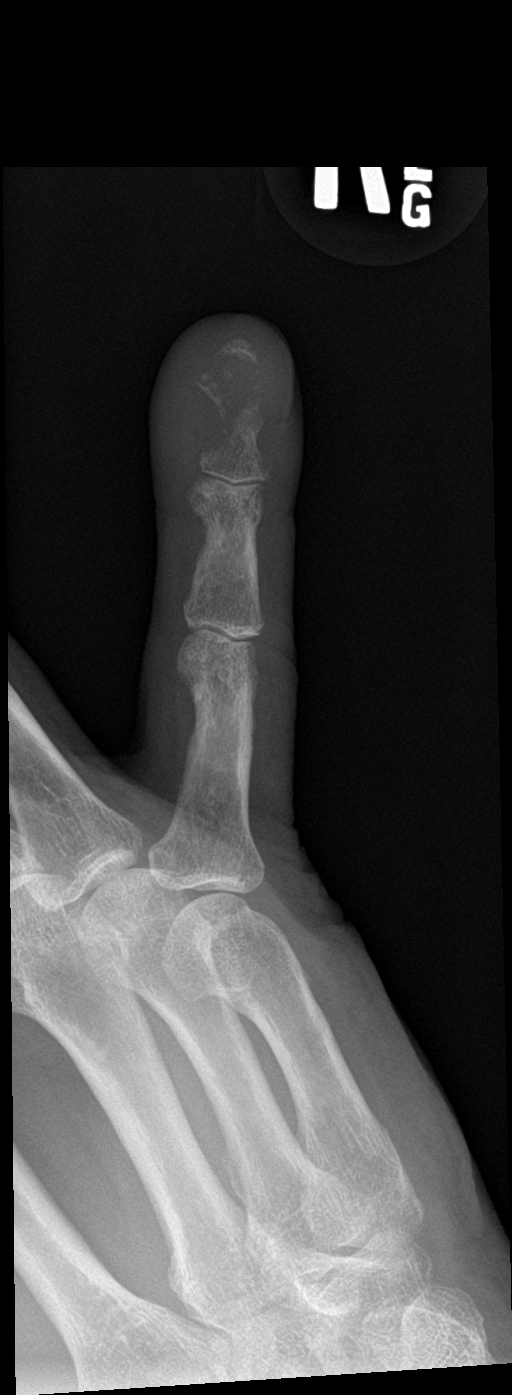

[finger lat]
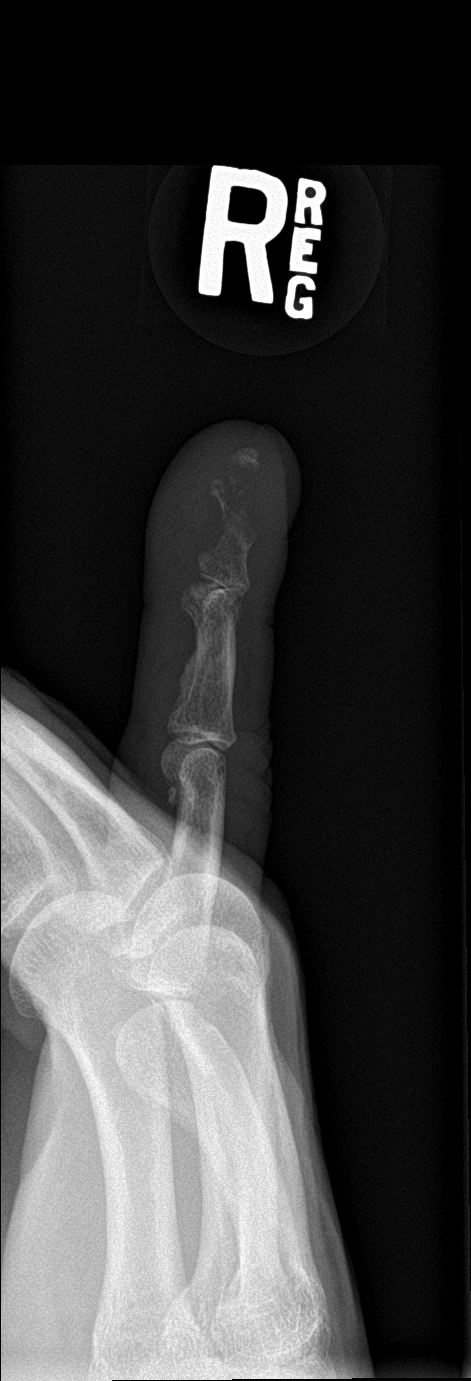

[3 of 3 positions shown; findings below may reference images not displayed]

FINDINGS: Soft tissue swelling noted of the distal aspect of the right fifth
digit. Destructive changes are noted of the distal phalanx. An
expansile lesion may be present. An active bony tumor including
metastatic lesion could present this fashion. Infection could
present this fashion. Orthopedic evaluation suggested. Diffuse
degenerative change. No radiopaque foreign body.
IMPRESSION: Destructive changes noted of the distal phalanx of the right fifth
digit as described above. Associated soft tissue swelling. An active
bony tumor including metastatic lesion could present this fashion.
Infection could present this fashion. Orthopedic evaluation
suggested.

These results will be called to the ordering clinician or
representative by the Radiologist Assistant, and communication
documented in the PACS or zVision Dashboard.

## 2018-09-12 ENCOUNTER — Emergency Department (INDEPENDENT_AMBULATORY_CARE_PROVIDER_SITE_OTHER)
Admission: EM | Admit: 2018-09-12 | Discharge: 2018-09-12 | Disposition: A | Discharge status: Home / Self Care | Payer: 59 | Source: Home / Self Care

## 2018-09-12 ENCOUNTER — Encounter: Payer: Self-pay | Admitting: Emergency Medicine

## 2018-09-12 DIAGNOSIS — J209 Acute bronchitis, unspecified: Secondary | ICD-10-CM

## 2018-09-12 MED ORDER — AZITHROMYCIN 250 MG PO TABS: 250.0000 mg | ORAL_TABLET | Freq: Every day | ORAL | 0 refills | Status: DC

## 2018-09-12 MED ORDER — BENZONATATE 100 MG PO CAPS: 100.0000 mg | ORAL_CAPSULE | Freq: Three times a day (TID) | ORAL | 0 refills | Status: DC

## 2018-09-12 NOTE — ED Provider Notes (Signed)
[facility]    CSN: [id number] Arrival date & time: 09/12/18  1203     History   Chief Complaint Chief Complaint  Patient presents with  . URI    HPI Wesley Oneill is a 58 y.o. male.   HPI  Wesley Oneill is a 58 y.o. male presenting to UC with c/o 1 week of worsening cough, congestion and now intermittent dizziness described as lightheadedness. Mild relief with OTC mucinex. Subjective fever with chills.  Denies n/v/d. His wife has been sick this past week with similar symptoms as well. Hx of "walking pneumonia" and bronchitis in the past.    Past Medical History:  Diagnosis Date  . Hypertension     There are no active problems to display for this patient.   Past Surgical History:  Procedure Laterality Date  . APPENDECTOMY         Home Medications    Prior to Admission medications   Medication Sig Start Date End Date Taking? Authorizing Provider  azithromycin (ZITHROMAX) 250 MG tablet Take 1 tablet (250 mg total) by mouth daily. Take first 2 tablets together, then 1 every day until finished. 09/12/18   Lurene Shadow, PA-C  benzonatate (TESSALON) 100 MG capsule Take 1-2 capsules (100-200 mg total) by mouth every 8 (eight) hours. 09/12/18   Lurene Shadow, PA-C    Family History Family History  Problem Relation Age of Onset  . Heart disease Father     Social History Social History   Tobacco Use  . Smoking status: Current Every Day Smoker    Packs/day: 1.00    Years: 35.00    Pack years: 35.00  . Smokeless tobacco: Never Used  Substance Use Topics  . Alcohol use: Yes  . Drug use: No     Allergies   Patient has no known allergies.   Review of Systems Review of Systems  Constitutional: Positive for chills, fatigue and fever ( subjective).  HENT: Positive for congestion. Negative for ear pain, sore throat, trouble swallowing and voice change.   Respiratory: Positive for cough and chest tightness. Negative for shortness of breath.     Cardiovascular: Negative for chest pain and palpitations.  Gastrointestinal: Negative for abdominal pain, diarrhea, nausea and vomiting.  Musculoskeletal: Negative for arthralgias, back pain and myalgias.  Skin: Negative for rash.  Neurological: Positive for dizziness and light-headedness. Negative for syncope and headaches.     Physical Exam Triage Vital Signs ED Triage Vitals  Enc Vitals Group     BP 09/12/18 1241 (!) 145/82     Pulse Rate 09/12/18 1241 74     Resp --      Temp 09/12/18 1241 98.1 F (36.7 C)     Temp Source 09/12/18 1241 Oral     SpO2 09/12/18 1241 95 %     Weight 09/12/18 1242 184 lb 8 oz (83.7 kg)     Height 09/12/18 1242 5\' 11"  (1.803 m)     Head Circumference --      Peak Flow --      Pain Score 09/12/18 1242 0     Pain Loc --      Pain Edu? --      Excl. in GC? --    No data found.  Updated Vital Signs BP (!) 145/82 (BP Location: Right Arm)   Pulse 74   Temp 98.1 F (36.7 C) (Oral)   Ht 5\' 11"  (1.803 m)   Wt 184 lb 8 oz (83.7 kg)  SpO2 95%   BMI 25.73 kg/m   Visual Acuity Right Eye Distance:   Left Eye Distance:   Bilateral Distance:    Right Eye Near:   Left Eye Near:    Bilateral Near:     Physical Exam Vitals signs and nursing note reviewed.  Constitutional:      Appearance: Normal appearance. He is well-developed.  HENT:     Head: Normocephalic and atraumatic.     Right Ear: Tympanic membrane normal.     Left Ear: Tympanic membrane normal.     Nose: Nose normal.     Right Sinus: No maxillary sinus tenderness or frontal sinus tenderness.     Left Sinus: No maxillary sinus tenderness or frontal sinus tenderness.     Mouth/Throat:     Lips: Pink.     Mouth: Mucous membranes are moist.     Pharynx: Oropharynx is clear. Uvula midline.  Neck:     Musculoskeletal: Normal range of motion.  Cardiovascular:     Rate and Rhythm: Normal rate and regular rhythm.  Pulmonary:     Effort: Pulmonary effort is normal. No respiratory  distress.     Breath sounds: No stridor. Rhonchi ( Left lower lung field) and rales ( left lower lung field) present. No wheezing.  Musculoskeletal: Normal range of motion.  Skin:    General: Skin is warm and dry.  Neurological:     Mental Status: He is alert and oriented to person, place, and time.  Psychiatric:        Behavior: Behavior normal.      UC Treatments / Results  Labs (all labs ordered are listed, but only abnormal results are displayed) Labs Reviewed - No data to display  EKG None  Radiology No results found.  Procedures Procedures (including critical care time)  Medications Ordered in UC Medications - No data to display  Initial Impression / Assessment and Plan / UC Course  I have reviewed the triage vital signs and the nursing notes.  Pertinent labs & imaging results that were available during my care of the patient were reviewed by me and considered in my medical decision making (see chart for details).     Hx and exam concerning for early pneumonia with the left lower lung sounds Will start pt on Azithromycin Home care info provided.  Final Clinical Impressions(s) / UC Diagnoses   Final diagnoses:  Acute bronchitis, unspecified organism     Discharge Instructions      Please take antibiotics as prescribed and be sure to complete entire course even if you start to feel better to ensure infection does not come back.  You may take 500mg  acetaminophen every 4-6 hours or in combination with ibuprofen 400-600mg  every 6-8 hours as needed for pain, inflammation, and fever.  Be sure to well hydrated with clear liquids and get at least 8 hours of sleep at night, preferably more while sick.   Please follow up with family medicine in 1 week if needed.     ED Prescriptions    Medication Sig Dispense Auth. Provider   benzonatate (TESSALON) 100 MG capsule Take 1-2 capsules (100-200 mg total) by mouth every 8 (eight) hours. 21 capsule Doroteo GlassmanPhelps, Lesslie Mossa O,  PA-C   azithromycin (ZITHROMAX) 250 MG tablet Take 1 tablet (250 mg total) by mouth daily. Take first 2 tablets together, then 1 every day until finished. 6 tablet Lurene ShadowPhelps, Dwyne Hasegawa O, PA-C     Controlled Substance Prescriptions  Controlled Substance Registry consulted?  Not Applicable   Rolla Platehelps, Treyvone Chelf O, PA-C 09/12/18 1319

## 2018-09-12 NOTE — Discharge Instructions (Signed)

## 2018-09-12 NOTE — ED Triage Notes (Signed)
Patient c/o chest congestion, sore throat, runny nose x 1 week, dizzy spells comes and goes, no ear pain, taking Mucinex.

## 2020-11-23 ENCOUNTER — Emergency Department (INDEPENDENT_AMBULATORY_CARE_PROVIDER_SITE_OTHER)
Admission: EM | Admit: 2020-11-23 | Discharge: 2020-11-23 | Disposition: A | Discharge status: Home / Self Care | Payer: 59 | Source: Home / Self Care | Attending: Family Medicine | Admitting: Family Medicine

## 2020-11-23 ENCOUNTER — Encounter: Payer: Self-pay | Admitting: Emergency Medicine

## 2020-11-23 ENCOUNTER — Other Ambulatory Visit: Payer: Self-pay

## 2020-11-23 DIAGNOSIS — J069 Acute upper respiratory infection, unspecified: Secondary | ICD-10-CM

## 2020-11-23 MED ORDER — DOXYCYCLINE HYCLATE 100 MG PO CAPS: ORAL_CAPSULE | ORAL | 0 refills | Status: AC

## 2020-11-23 NOTE — Discharge Instructions (Addendum)
Take plain guaifenesin (1200mg  extended release tabs such as Mucinex) twice daily, with plenty of water, for cough and congestion.  May add Pseudoephedrine (30mg , one or two every 4 to 6 hours) for sinus congestion.  Get adequate rest.   May take Delsym Cough Suppressant ("12 Hour Cough Relief") at bedtime for nighttime cough.  Stop all antihistamines for now, and other non-prescription cough/cold preparations. May take Ibuprofen 200mg , 4 tabs every 8 hours with food for chest/sternum discomfort, fever, etc.

## 2020-11-23 NOTE — ED Triage Notes (Signed)
Congestion, cough,sinus pressure, runny nose x 1 week.  Unvaccinated

## 2020-11-23 NOTE — ED Provider Notes (Signed)
[facility]    CSN: [id number] Arrival date & time: 11/23/20  1320      History   Chief Complaint Chief Complaint  Patient presents with  . Nasal Congestion    HPI Wesley Oneill is a 60 y.o. male.   1.5 weeks ago patient developed sinus congestion, followed by a cough.  He has remained fatigued, complaining of persistent cough, headache, and low grade fever. He has a past history of pneumonia and continues to smoke.  The history is provided by the patient.    Past Medical History:  Diagnosis Date  . Hypertension     There are no problems to display for this patient.   Past Surgical History:  Procedure Laterality Date  . APPENDECTOMY         Home Medications    Prior to Admission medications   Medication Sig Start Date End Date Taking? Authorizing Provider  dextromethorphan-guaiFENesin (MUCINEX DM) 30-600 MG 12hr tablet Take 1 tablet by mouth 2 (two) times daily.   Yes [provider]  doxycycline (VIBRAMYCIN) 100 MG capsule Take one cap PO Q12hr with food. 11/23/20  Yes Lattie Haw, MD  pseudoephedrine (SUDAFED) 30 MG tablet Take 30 mg by mouth every 4 (four) hours as needed for congestion.   Yes [provider]    Family History Family History  Problem Relation Age of Onset  . Heart disease Father   . Alzheimer's disease Mother     Social History Social History   Tobacco Use  . Smoking status: Current Every Day Smoker    Packs/day: 1.00    Years: 35.00    Pack years: 35.00  . Smokeless tobacco: Never Used  Vaping Use  . Vaping Use: Never used  Substance Use Topics  . Alcohol use: Yes  . Drug use: No     Allergies   Patient has no known allergies.   Review of Systems Review of Systems No sore throat + cough No pleuritic pain No wheezing + nasal congestion + post-nasal drainage + sinus pain/pressure No itchy/red eyes No earache No hemoptysis No SOB + low grade fever No nausea No  vomiting No abdominal pain No diarrhea No urinary symptoms No skin rash + fatigue No myalgias + headache Used OTC meds (Alka Seltzer, Mucinex) without relief   Physical Exam Triage Vital Signs ED Triage Vitals [11/23/20 1334]  Enc Vitals Group     BP 114/78     Pulse Rate (!) 102     Resp 18     Temp 98.4 F (36.9 C)     Temp Source Oral     SpO2 98 %     Weight 170 lb (77.1 kg)     Height 5\' 8"  (1.727 m)     Head Circumference      Peak Flow      Pain Score 0     Pain Loc      Pain Edu?      Excl. in GC?    No data found.  Updated Vital Signs BP 114/78 (BP Location: Left Arm)   Pulse (!) 102   Temp 98.4 F (36.9 C) (Oral)   Resp 18   Ht 5\' 8"  (1.727 m)   Wt 77.1 kg   SpO2 98%   BMI 25.85 kg/m   Visual Acuity Right Eye Distance:   Left Eye Distance:   Bilateral Distance:    Right Eye Near:   Left Eye Near:    Bilateral  Near:     Physical Exam Nursing notes and Vital Signs reviewed. Appearance:  Patient appears stated age, and in no acute distress Eyes:  Pupils are equal, round, and reactive to light and accomodation.  Extraocular movement is intact.  Conjunctivae are not inflamed  Ears:  Canals normal.  Tympanic membranes normal.  Nose:  Mildly congested turbinates.  Maxillary sinus tenderness is present.  Pharynx:  Normal Neck:  Supple.  Mildly enlarged lateral nodes are present, tender to palpation on the left.   Lungs:  Clear to auscultation.  Breath sounds are equal.  Moving air well. Heart:  Regular rate and rhythm without murmurs, rubs, or gallops.  Abdomen:  Nontender without masses or hepatosplenomegaly.  Bowel sounds are present.  No CVA or flank tenderness.  Extremities:  No edema.  Skin:  No rash present.   UC Treatments / Results  Labs (all labs ordered are listed, but only abnormal results are displayed) Labs Reviewed - No data to display  EKG   Radiology No results found.  Procedures Procedures (including critical care  time)  Medications Ordered in UC Medications - No data to display  Initial Impression / Assessment and Plan / UC Course  I have reviewed the triage vital signs and the nursing notes.  Pertinent labs & imaging results that were available during my care of the patient were reviewed by me and considered in my medical decision making (see chart for details).    Patient is a smoker with history of pneumonia.  Begin empiric doxycycline. Followup with Family Doctor if not improved in 10 days.   Final Clinical Impressions(s) / UC Diagnoses   Final diagnoses:  Viral URI with cough     Discharge Instructions     Take plain guaifenesin (1200mg  extended release tabs such as Mucinex) twice daily, with plenty of water, for cough and congestion.  May add Pseudoephedrine (30mg , one or two every 4 to 6 hours) for sinus congestion.  Get adequate rest.   May take Delsym Cough Suppressant ("12 Hour Cough Relief") at bedtime for nighttime cough.  Stop all antihistamines for now, and other non-prescription cough/cold preparations. May take Ibuprofen 200mg , 4 tabs every 8 hours with food for chest/sternum discomfort, fever, etc.      ED Prescriptions    Medication Sig Dispense Auth. Provider   doxycycline (VIBRAMYCIN) 100 MG capsule Take one cap PO Q12hr with food. 20 capsule , MD        , MD 11/25/20 [phone]

## 2021-03-31 ENCOUNTER — Ambulatory Visit: Payer: Self-pay
